# Patient Record
Sex: Female | Born: 2000 | Race: Black or African American | Hispanic: No | Marital: Single | Smoking: Never smoker
Health system: Southern US, Community
[De-identification: ages and names within clinical notes are randomized; demographics above are authoritative.]

## PROBLEM LIST (undated history)

## (undated) DIAGNOSIS — R569 Unspecified convulsions: Secondary | ICD-10-CM

## (undated) DIAGNOSIS — R51 Headache: Secondary | ICD-10-CM

## (undated) DIAGNOSIS — G43909 Migraine, unspecified, not intractable, without status migrainosus: Secondary | ICD-10-CM

## (undated) DIAGNOSIS — H539 Unspecified visual disturbance: Secondary | ICD-10-CM

## (undated) DIAGNOSIS — M419 Scoliosis, unspecified: Secondary | ICD-10-CM

## (undated) DIAGNOSIS — R519 Headache, unspecified: Secondary | ICD-10-CM

## (undated) HISTORY — PX: TONSILLECTOMY AND ADENOIDECTOMY: SHX28

---
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
## 2020-04-07 ENCOUNTER — Encounter (HOSPITAL_BASED_OUTPATIENT_CLINIC_OR_DEPARTMENT_OTHER): Payer: Self-pay

## 2020-04-07 ENCOUNTER — Other Ambulatory Visit: Payer: Self-pay

## 2020-04-07 ENCOUNTER — Emergency Department (HOSPITAL_BASED_OUTPATIENT_CLINIC_OR_DEPARTMENT_OTHER)
Admission: EM | Admit: 2020-04-07 | Discharge: 2020-04-07 | Disposition: A | Discharge status: Home / Self Care | Payer: Medicaid Other | Attending: Emergency Medicine | Admitting: Emergency Medicine

## 2020-04-07 DIAGNOSIS — K625 Hemorrhage of anus and rectum: Secondary | ICD-10-CM | POA: Insufficient documentation

## 2020-04-07 LAB — CBC WITH DIFFERENTIAL/PLATELET
Abs Immature Granulocytes: 0.02 10*3/uL (ref 0.00–0.07)
Basophils Absolute: 0.1 10*3/uL (ref 0.0–0.1)
Basophils Relative: 1 %
Eosinophils Absolute: 0.2 10*3/uL (ref 0.0–0.5)
Eosinophils Relative: 2 %
HCT: 40.5 % (ref 36.0–46.0)
Hemoglobin: 12.4 g/dL (ref 12.0–15.0)
Immature Granulocytes: 0 %
Lymphocytes Relative: 38 %
Lymphs Abs: 3.5 10*3/uL (ref 0.7–4.0)
MCH: 20 pg — ABNORMAL LOW (ref 26.0–34.0)
MCHC: 30.6 g/dL (ref 30.0–36.0)
MCV: 65.3 fL — ABNORMAL LOW (ref 80.0–100.0)
Monocytes Absolute: 0.6 10*3/uL (ref 0.1–1.0)
Monocytes Relative: 6 %
Neutro Abs: 4.9 10*3/uL (ref 1.7–7.7)
Neutrophils Relative %: 53 %
Platelets: 316 10*3/uL (ref 150–400)
RBC: 6.2 MIL/uL — ABNORMAL HIGH (ref 3.87–5.11)
RDW: 17.9 % — ABNORMAL HIGH (ref 11.5–15.5)
WBC: 9.2 10*3/uL (ref 4.0–10.5)
nRBC: 0 % (ref 0.0–0.2)

## 2020-04-07 LAB — OCCULT BLOOD X 1 CARD TO LAB, STOOL: Fecal Occult Bld: NEGATIVE

## 2020-04-07 LAB — COMPREHENSIVE METABOLIC PANEL
ALT: 15 U/L (ref 0–44)
AST: 17 U/L (ref 15–41)
Albumin: 4.2 g/dL (ref 3.5–5.0)
Alkaline Phosphatase: 71 U/L (ref 38–126)
Anion gap: 10 (ref 5–15)
BUN: 15 mg/dL (ref 6–20)
CO2: 24 mmol/L (ref 22–32)
Calcium: 9.2 mg/dL (ref 8.9–10.3)
Chloride: 104 mmol/L (ref 98–111)
Creatinine, Ser: 0.89 mg/dL (ref 0.44–1.00)
GFR, Estimated: >60 mL/min (ref >60)
Glucose, Bld: 96 mg/dL (ref 70–99)
Potassium: 4.1 mmol/L (ref 3.5–5.1)
Sodium: 138 mmol/L (ref 135–145)
Total Bilirubin: 0.2 mg/dL — ABNORMAL LOW (ref 0.3–1.2)
Total Protein: 7.3 g/dL (ref 6.5–8.1)

## 2020-04-07 NOTE — Discharge Instructions (Signed)
Follow-up with the gastroenterologist on this matter. Be sure to stay well-hydrated by drinking plenty of water. Include fiber in your diet to ensure bowel movements are regular and soft. Return to the emergency department for abdominal pain, fever with your symptoms, bleeding in between bowel movements, passing out, chest pain, shortness of breath, or any other major concerns.

## 2020-04-07 NOTE — ED Triage Notes (Addendum)
Pt states she gave birth 3 months ago. Believes she has a hemorrhoid. States has been bleeding for the past month, worse the past 3 days. Having to wear a pad. States feeling lightheaded/SOB. Hx of anemia

## 2020-04-07 NOTE — ED Provider Notes (Signed)
[facility] EMERGENCY DEPARTMENT Provider Note   CSN: [id number] Arrival date & time: 04/07/20  1721     History Chief Complaint  Patient presents with  . Rectal Bleeding    Kelli Bishop is a 19 y.o. female.  HPI      Kelli Bishop is a 19 y.o. female, with a history of migraines, MS, presenting to the ED with rectal bleeding for the past few months. Patient states she delivered her child about 3 months ago. She started to have rectal bleeding with bowel movements around that time. She states her OB/GYN told her that it was probably a hemorrhoid. She began to have more bleeding with bowel movement over the last week. She does not have bleeding without bowel movement. She does have some pain with bowel movements, however, denies any difficulty with bowel movements. Denies anticoagulation. Denies fever/chills, nausea/vomiting, constipation, rectal pain, rectal trauma, vaginal bleeding, abdominal pain, syncope, shortness of breath, chest pain, or any other complaints.  Past Medical History:  Diagnosis Date  . Headache   . Migraines   . Migraines   . Multilevel scoliosis   . Seizures ([facility])   . Vision abnormalities     Patient Active Problem List   Diagnosis Date Noted  . Adjustment reaction to medical therapy   . MS (multiple sclerosis) ([facility])   . Diplopia 07/05/2016  . Dizziness 07/05/2016  . Multifocal distribution of white matter abnormalities present on MRI 07/05/2016  . Back pain   . Incontinence   . Vasovagal syncope 11/13/2015  . Moderate headache 11/13/2015  . Seizure-like activity ([facility]) 11/13/2015    Past Surgical History:  Procedure Laterality Date  . TONSILLECTOMY AND ADENOIDECTOMY Bilateral      OB History    Gravida  1   Para      Term      Preterm      AB      Living        SAB      TAB      Ectopic      Multiple      Live Births              Family History  Problem Relation Age of Onset  . Migraines Mother          Resolved   . Depression Father   . Anxiety disorder Father   . Schizophrenia Maternal Grandmother   . Migraines Maternal Grandmother   . Hypertension Maternal Grandmother   . Diabetes Maternal Grandmother     Social History   Tobacco Use  . Smoking status: Never Smoker  . Smokeless tobacco: Never Used  Substance Use Topics  . Alcohol use: No  . Drug use: No    Comment: Has tried marijuana    Home Medications Prior to Admission medications   Medication Sig Start Date End Date Taking? Authorizing Provider  acetaminophen (TYLENOL) 500 MG tablet Take 1,000 mg by mouth every 6 (six) hours as needed for headache (pain).     [provider]  calcium carbonate (TUMS - DOSED IN MG ELEMENTAL CALCIUM) 500 MG chewable tablet Chew 2 tablets (400 mg of elemental calcium total) by mouth 3 (three) times daily as needed for indigestion or heartburn. 07/24/19   Gerrit Heck, CNM  cholecalciferol (VITAMIN D) 1000 units tablet Take 1 tablet (1,000 Units total) by mouth daily. 07/08/16   Rockney Ghee, MD  cyclobenzaprine (FLEXERIL) 5 MG tablet Take 1 tablet (5 mg  total) by mouth 3 (three) times daily as needed for muscle spasms. 07/24/19   Gerrit Heck, CNM  multivitamin (VIT Lorel [place] C) CHEW chewable tablet Chew 2 tablets by mouth daily.    [provider]    Allergies    Orange fruit [citrus]  Review of Systems   Review of Systems  Constitutional: Negative for chills and fever.  Respiratory: Negative for shortness of breath.   Cardiovascular: Negative for chest pain.  Gastrointestinal: Positive for blood in stool. Negative for abdominal pain, constipation, diarrhea, nausea and vomiting.  Genitourinary: Negative for vaginal bleeding and vaginal discharge.  Musculoskeletal: Negative for back pain.  Neurological: Negative for syncope and weakness.  All other systems reviewed and are negative.   Physical Exam Updated Vital Signs BP 118/85 (BP Location: Left Arm)    Pulse 87   Temp 98.3 F (36.8 C) (Oral)   Resp 18   Ht 5\' 6"  (1.676 m)   Wt 87 kg   SpO2 100%   BMI 30.96 kg/m   Physical Exam Vitals and nursing note reviewed. Exam conducted with a chaperone present.  Constitutional:      General: She is not in acute distress.    Appearance: She is well-developed. She is not diaphoretic.  HENT:     Head: Normocephalic and atraumatic.     Mouth/Throat:     Mouth: Mucous membranes are moist.     Pharynx: Oropharynx is clear.  Eyes:     Conjunctiva/sclera: Conjunctivae normal.  Cardiovascular:     Rate and Rhythm: Normal rate and regular rhythm.     Pulses: Normal pulses.          Radial pulses are 2+ on the right side and 2+ on the left side.  Pulmonary:     Effort: Pulmonary effort is normal. No respiratory distress.  Abdominal:     Palpations: Abdomen is soft.     Tenderness: There is no abdominal tenderness. There is no guarding.  Genitourinary:    Rectum: Guaiac result negative.     Comments: Rectal Exam:  No external hemorrhoids, fissures, or lesions noted.  No frank blood or melena. No stool burden.  No rectal tenderness. No foreign bodies noted.   Med Tech served as chaperone during the rectal exam. Musculoskeletal:     Cervical back: Neck supple.  Lymphadenopathy:     Cervical: No cervical adenopathy.  Skin:    General: Skin is warm and dry.  Neurological:     Mental Status: She is alert.  Psychiatric:        Mood and Affect: Mood and affect normal.        Speech: Speech normal.        Behavior: Behavior normal.     ED Results / Procedures / Treatments   Labs (all labs ordered are listed, but only abnormal results are displayed) Labs Reviewed  COMPREHENSIVE METABOLIC PANEL - Abnormal; Notable for the following components:      Result Value   Total Bilirubin 0.2 (*)    All other components within normal limits  CBC WITH DIFFERENTIAL/PLATELET - Abnormal; Notable for the following components:   RBC 6.20 (*)    MCV  65.3 (*)    MCH 20.0 (*)    RDW 17.9 (*)    All other components within normal limits  OCCULT BLOOD X 1 CARD TO LAB, STOOL    EKG None  Radiology No results found.  Procedures Procedures (including critical care time)  Medications Ordered in  ED Medications - No data to display  ED Course  I have reviewed the triage vital signs and the nursing notes.  Pertinent labs & imaging results that were available during my care of the patient were reviewed by me and considered in my medical decision making (see chart for details).    MDM Rules/Calculators/A&P                          Patient presents with complaint of rectal bleeding. Patient is nontoxic appearing, afebrile, not tachycardic, not tachypneic, not hypotensive, excellent SPO2 on room air, and is in no apparent distress.   I have reviewed the patient's chart to obtain more information.   I reviewed and interpreted the patient's labs. Abdominal exam benign. No evidence of anemia. No frank rectal bleeding on exam. Hemoccult negative. GI follow-up recommended.  Return precautions discussed. Patient voices understanding of these instructions, accepts the plan, and is comfortable with discharge.     Final Clinical Impression(s) / ED Diagnoses Final diagnoses:  Rectal bleeding    Rx / DC Orders ED Discharge Orders    None       Anselm Pancoast, PA-C 04/07/20 2009    Virgina Norfolk, DO 04/07/20 2011

## 2021-02-28 ENCOUNTER — Emergency Department (HOSPITAL_BASED_OUTPATIENT_CLINIC_OR_DEPARTMENT_OTHER): Payer: Medicaid Other

## 2021-02-28 ENCOUNTER — Encounter (HOSPITAL_BASED_OUTPATIENT_CLINIC_OR_DEPARTMENT_OTHER): Payer: Self-pay | Admitting: *Deleted

## 2021-02-28 ENCOUNTER — Emergency Department (HOSPITAL_BASED_OUTPATIENT_CLINIC_OR_DEPARTMENT_OTHER)
Admission: EM | Admit: 2021-02-28 | Discharge: 2021-02-28 | Disposition: A | Discharge status: Home / Self Care | Payer: Medicaid Other | Attending: Emergency Medicine | Admitting: Emergency Medicine

## 2021-02-28 ENCOUNTER — Other Ambulatory Visit: Payer: Self-pay

## 2021-02-28 DIAGNOSIS — X501XXA Overexertion from prolonged static or awkward postures, initial encounter: Secondary | ICD-10-CM | POA: Insufficient documentation

## 2021-02-28 DIAGNOSIS — S8992XA Unspecified injury of left lower leg, initial encounter: Secondary | ICD-10-CM | POA: Insufficient documentation

## 2021-02-28 DIAGNOSIS — M25562 Pain in left knee: Secondary | ICD-10-CM | POA: Diagnosis not present

## 2021-02-28 MED ORDER — KETOROLAC TROMETHAMINE 30 MG/ML IJ SOLN
30.0000 mg | Freq: Once | INTRAMUSCULAR | Status: AC
  Administered 2021-02-28: 30 mg via INTRAMUSCULAR
  Filled 2021-02-28: qty 1

## 2021-02-28 NOTE — ED Triage Notes (Signed)
Brought in by ems from home c/o left knee pain , hx of same

## 2021-02-28 NOTE — Discharge Instructions (Addendum)
Take Tylenol and ibuprofen as needed for pain. Use the immobilization brace as needed when you are not in the shower or sleep at night.  You can also use the crutches throughout the day to help with pain.  Please schedule follow-up with the orthopedic group listed above for additional evaluation.  They may need to do an MRI to rule out any ligamentous involvement.  If things change or worsen please turn back to the ED as needed.

## 2021-02-28 NOTE — ED Provider Notes (Signed)
[facility] EMERGENCY DEPARTMENT Provider Note   CSN: [id number] Arrival date & time: 02/28/21  1434     History Chief Complaint  Patient presents with   Knee Injury    Kelli Bishop is a 20 y.o. female.  HPI  Patient presents with left knee pain.  This happened acutely, she was sitting on the floor in a squatting position and then stepped up.  She heard a popping sound in the left knee.  Was unable to bear weight afterwards, denies any twisting sensation or fall on the knee.  No previous surgeries on the knee.  Reports this is the 6 or 7 time this is happened, usually when she goes and squatting to standing she will hear popping position.  States the pain was worse this time than previous times.  Pain was a 9 out of 10 when it initially started, it improved with putting on a knee brace.  It is currently a 5 out of 10.  Pain is exacerbated by putting pressure on it.  She is able to stand with the brace on.  Past Medical History:  Diagnosis Date   Headache    Migraines    Migraines    Multilevel scoliosis    Seizures (HCC)    Vision abnormalities     Patient Active Problem List   Diagnosis Date Noted   Adjustment reaction to medical therapy    MS (multiple sclerosis) (HCC)    Diplopia 07/05/2016   Dizziness 07/05/2016   Multifocal distribution of white matter abnormalities present on MRI 07/05/2016   Back pain    Incontinence    Vasovagal syncope 11/13/2015   Moderate headache 11/13/2015   Seizure-like activity (HCC) 11/13/2015    Past Surgical History:  Procedure Laterality Date   TONSILLECTOMY AND ADENOIDECTOMY Bilateral      OB History     Gravida  1   Para      Term      Preterm      AB      Living         SAB      IAB      Ectopic      Multiple      Live Births              Family History  Problem Relation Age of Onset   Migraines Mother        Resolved    Depression Father    Anxiety disorder Father    Schizophrenia  Maternal Grandmother    Migraines Maternal Grandmother    Hypertension Maternal Grandmother    Diabetes Maternal Grandmother     Social History   Tobacco Use   Smoking status: Never   Smokeless tobacco: Never  Substance Use Topics   Alcohol use: No   Drug use: No    Comment: Has tried marijuana    Home Medications Prior to Admission medications   Medication Sig Start Date End Date Taking? Authorizing Provider  acetaminophen (TYLENOL) 500 MG tablet Take 1,000 mg by mouth every 6 (six) hours as needed for headache (pain).     [provider]  calcium carbonate (TUMS - DOSED IN MG ELEMENTAL CALCIUM) 500 MG chewable tablet Chew 2 tablets (400 mg of elemental calcium total) by mouth 3 (three) times daily as needed for indigestion or heartburn. 07/24/19   Gerrit Heck, CNM  cholecalciferol (VITAMIN D) 1000 units tablet Take 1 tablet (1,000 Units total) by mouth daily. 07/08/16  Rockney Ghee, MD  cyclobenzaprine (FLEXERIL) 5 MG tablet Take 1 tablet (5 mg total) by mouth 3 (three) times daily as needed for muscle spasms. 07/24/19   Gerrit Heck, CNM  multivitamin (VIT Lorel Monaco C) CHEW chewable tablet Chew 2 tablets by mouth daily.    [provider]    Allergies    Orange fruit [citrus] and Orange oil  Review of Systems   Review of Systems  Constitutional:  Negative for fatigue and fever.  Musculoskeletal:  Positive for arthralgias and gait problem. Negative for joint swelling.   Physical Exam Updated Vital Signs BP 125/86 (BP Location: Left Arm)   Pulse 69   Temp 98.4 F (36.9 C) (Oral)   Resp 18   Ht 5' 5.5" (1.664 m)   Wt 96.6 kg   LMP 02/21/2021   SpO2 99%   BMI 34.91 kg/m   Physical Exam Vitals and nursing note reviewed. Exam conducted with a chaperone present.  Constitutional:      General: She is not in acute distress.    Appearance: Normal appearance.  HENT:     Head: Normocephalic and atraumatic.  Eyes:     General: No scleral  icterus.    Extraocular Movements: Extraocular movements intact.     Pupils: Pupils are equal, round, and reactive to light.  Cardiovascular:     Comments: DP and PT are 2+. Musculoskeletal:        General: Tenderness present. No swelling.     Comments: Decreased range of motion to the left knee.  Patient is able to withstand flexion, extension, internal and external rotation minimally.  No tenderness with mobilization of the patella.  No significant effusion.  Skin:    Capillary Refill: Capillary refill takes less than 2 seconds.     Coloration: Skin is not jaundiced.  Neurological:     Mental Status: She is alert. Mental status is at baseline.     Sensory: No sensory deficit.     Coordination: Coordination normal.    ED Results / Procedures / Treatments   Labs (all labs ordered are listed, but only abnormal results are displayed) Labs Reviewed - No data to display  EKG None  Radiology DG Knee Complete 4 Views Left  Result Date: 02/28/2021 CLINICAL DATA:  Left knee pain. EXAM: LEFT KNEE - COMPLETE 4+ VIEW COMPARISON:  None. FINDINGS: No evidence of fracture, dislocation, or joint effusion. No evidence of arthropathy or other focal bone abnormality. Soft tissues are unremarkable. IMPRESSION: Negative. Electronically Signed   By: Aram Candela M.D.   On: 02/28/2021 15:39    Procedures Procedures   Medications Ordered in ED Medications - No data to display  ED Course  I have reviewed the triage vital signs and the nursing notes.  Pertinent labs & imaging results that were available during my care of the patient were reviewed by me and considered in my medical decision making (see chart for details).    MDM Rules/Calculators/A&P                           Patient vitals are stable.  Not febrile or tachycardic, doubt a septic joint.  She is also able to tolerate range another reason at all think this is a septic joint.  Radiograph is negative for any fracture dislocation.   Patient is very tender, but she is able to bear weight.  No previous history of MRI or surgeries to the knee, but frequent  history of "popping" followed by pain and requiring of immobilization brace.  Patient is able to bear weight and ambulate although does elicit some pain.  She is neurologically intact, I think she is appropriate for discharge with follow-up with orthopedics.  Will give crutches and have her in her immobilization brace.  Patient could have a ligamentous tear, will likely need outpatient MRI.  Can be done on a nonemergent basis.  Stable for discharge at this time.  Final Clinical Impression(s) / ED Diagnoses Final diagnoses:  None    Rx / DC Orders ED Discharge Orders     None        Theron Arista, Cordelia Poche 02/28/21 1910    Milagros Loll, MD 03/04/21 [phone]

## 2023-10-12 ENCOUNTER — Emergency Department (HOSPITAL_BASED_OUTPATIENT_CLINIC_OR_DEPARTMENT_OTHER)
Admission: EM | Admit: 2023-10-12 | Discharge: 2023-10-12 | Discharge status: Against Medical Advice | Attending: Emergency Medicine | Admitting: Emergency Medicine

## 2023-10-12 ENCOUNTER — Other Ambulatory Visit: Payer: Self-pay

## 2023-10-12 ENCOUNTER — Encounter (HOSPITAL_BASED_OUTPATIENT_CLINIC_OR_DEPARTMENT_OTHER): Payer: Self-pay | Admitting: *Deleted

## 2023-10-12 DIAGNOSIS — N939 Abnormal uterine and vaginal bleeding, unspecified: Secondary | ICD-10-CM | POA: Diagnosis not present

## 2023-10-12 DIAGNOSIS — K625 Hemorrhage of anus and rectum: Secondary | ICD-10-CM | POA: Diagnosis present

## 2023-10-12 DIAGNOSIS — R102 Pelvic and perineal pain unspecified side: Secondary | ICD-10-CM

## 2023-10-12 DIAGNOSIS — R109 Unspecified abdominal pain: Secondary | ICD-10-CM | POA: Insufficient documentation

## 2023-10-12 DIAGNOSIS — K5900 Constipation, unspecified: Secondary | ICD-10-CM

## 2023-10-12 DIAGNOSIS — Z3201 Encounter for pregnancy test, result positive: Secondary | ICD-10-CM

## 2023-10-12 DIAGNOSIS — K921 Melena: Secondary | ICD-10-CM

## 2023-10-12 LAB — CBC WITH DIFFERENTIAL/PLATELET
Abs Immature Granulocytes: 0.01 10*3/uL (ref 0.00–0.07)
Basophils Absolute: 0 10*3/uL (ref 0.0–0.1)
Basophils Relative: 0 %
Eosinophils Absolute: 0.1 10*3/uL (ref 0.0–0.5)
Eosinophils Relative: 1 %
HCT: 42.3 % (ref 36.0–46.0)
Hemoglobin: 12.9 g/dL (ref 12.0–15.0)
Immature Granulocytes: 0 %
Lymphocytes Relative: 28 %
Lymphs Abs: 1.7 10*3/uL (ref 0.7–4.0)
MCH: 20.7 pg — ABNORMAL LOW (ref 26.0–34.0)
MCHC: 30.5 g/dL (ref 30.0–36.0)
MCV: 67.8 fL — ABNORMAL LOW (ref 80.0–100.0)
Monocytes Absolute: 0.4 10*3/uL (ref 0.1–1.0)
Monocytes Relative: 7 %
Neutro Abs: 3.8 10*3/uL (ref 1.7–7.7)
Neutrophils Relative %: 64 %
Platelets: 241 10*3/uL (ref 150–400)
RBC: 6.24 MIL/uL — ABNORMAL HIGH (ref 3.87–5.11)
RDW: 17 % — ABNORMAL HIGH (ref 11.5–15.5)
WBC: 6 10*3/uL (ref 4.0–10.5)
nRBC: 0 % (ref 0.0–0.2)

## 2023-10-12 LAB — COMPREHENSIVE METABOLIC PANEL WITH GFR
ALT: 19 U/L (ref 0–44)
AST: 22 U/L (ref 15–41)
Albumin: 4.6 g/dL (ref 3.5–5.0)
Alkaline Phosphatase: 74 U/L (ref 38–126)
Anion gap: 11 (ref 5–15)
BUN: 13 mg/dL (ref 6–20)
CO2: 24 mmol/L (ref 22–32)
Calcium: 9.7 mg/dL (ref 8.9–10.3)
Chloride: 103 mmol/L (ref 98–111)
Creatinine, Ser: 0.81 mg/dL (ref 0.44–1.00)
GFR, Estimated: >60 mL/min (ref >60)
Glucose, Bld: 93 mg/dL (ref 70–99)
Potassium: 4 mmol/L (ref 3.5–5.1)
Sodium: 138 mmol/L (ref 135–145)
Total Bilirubin: 0.4 mg/dL (ref 0.0–1.2)
Total Protein: 6.9 g/dL (ref 6.5–8.1)

## 2023-10-12 LAB — LIPASE, BLOOD: Lipase: 28 U/L (ref 11–51)

## 2023-10-12 LAB — HCG, QUANTITATIVE, PREGNANCY: hCG, Beta Chain, Quant, S: 737 m[IU]/mL — ABNORMAL HIGH (ref <5)

## 2023-10-12 LAB — PREGNANCY, URINE: Preg Test, Ur: POSITIVE — AB

## 2023-10-12 MED ORDER — ONDANSETRON 4 MG PO TBDP
4.0000 mg | ORAL_TABLET | Freq: Once | ORAL | Status: AC
  Administered 2023-10-12: 4 mg via ORAL
  Filled 2023-10-12: qty 1

## 2023-10-12 MED ORDER — ACETAMINOPHEN 500 MG PO TABS
1000.0000 mg | ORAL_TABLET | Freq: Once | ORAL | Status: AC
  Administered 2023-10-12: 1000 mg via ORAL
  Filled 2023-10-12: qty 2

## 2023-10-12 NOTE — ED Provider Triage Note (Signed)
 Emergency Medicine Provider Triage Evaluation Note  Kelli Bishop , a 23 y.o. female  was evaluated in triage.  Pt complains of lower abdominal pain 12 hours, than had episode of bright red blood in stool after BM, thinks this is small amount of blood and constant since last BM. Has hx of hemorrhoids.  Review of Systems  Positive: Abd pain, N Negative: V D constipation   Physical Exam  BP (!) 140/85   Pulse 92   Temp 98 F (36.7 C)   Resp 18   Ht 5\' 5"  (1.651 m)   Wt 81.6 kg   LMP 09/19/2023   SpO2 100%   BMI 29.95 kg/m  Gen:   Awake, no distress   Resp:  Normal effort  MSK:   Moves extremities without difficulty  Other:    Medical Decision Making  Medically screening exam initiated at 2:50 PM.  Appropriate orders placed.  Kelli Bishop was informed that the remainder of the evaluation will be completed by another provider, this initial triage assessment does not replace that evaluation, and the importance of remaining in the ED until their evaluation is complete.  No BT   Eudora Heron, PA-C 10/12/23 1451

## 2023-10-12 NOTE — ED Notes (Signed)
 Patient wanting to leave. States she has to pick up daughter from [employer]. Aware of pending US , however reports she can't wait. States "ill see my regular doctor tomorrow". PA informed

## 2023-10-12 NOTE — ED Provider Notes (Signed)
 [place] EMERGENCY DEPARTMENT AT [facility] Provider Note   CSN: [id number] Arrival date & time: 10/12/23  1406     History  Chief Complaint  Patient presents with  . Rectal Bleeding    Kelli Bishop is a 23 y.o. female who presents emergency department with chief complaint of abdominal pain and rectal bleeding.  She has a past medical history of MS and is currently under treatment with [facility]. She had onset of intense cramping and.  She had urgency to urinate and defecate.  This went on all night and she was able unable to do both.  She states around 12:30 PM today she finally made a hard bowel movement and was able to urinate fully and empty her bladder.  She has had urgency to urinate since and has also had pain in her stomach and rectum.  She reports that when she made this bowel movement she noted blood in the toilet and had blood on her tissue paper when she wiped.  She had a hx of previous diagnosis of colitis and was told that this could be inflammatory as she has another autoimmune process but she has never followed up with GI specialist and was treated with antibiotics and improved.  Patient reports that she had some similar issues like this when she was pregnant in the past.  She is not currently taking birth control as she has tried to get pregnant her last menstrual period was on 09/22/2023.   Rectal Bleeding      Home Medications Prior to Admission medications   Medication Sig Start Date End Date Taking? Authorizing Provider  acetaminophen  (TYLENOL ) 500 MG tablet Take 1,000 mg by mouth every 6 (six) hours as needed for headache (pain).     [provider]  calcium  carbonate (TUMS - DOSED IN MG ELEMENTAL CALCIUM ) 500 MG chewable tablet Chew 2 tablets (400 mg of elemental calcium  total) by mouth 3 (three) times daily as needed for indigestion or heartburn. 07/24/19   Loetta Ringer, CNM  cholecalciferol  (VITAMIN D ) 1000 units tablet Take 1 tablet  (1,000 Units total) by mouth daily. 07/08/16   Anton Kirsten, MD  cyclobenzaprine  (FLEXERIL ) 5 MG tablet Take 1 tablet (5 mg total) by mouth 3 (three) times daily as needed for muscle spasms. 07/24/19   Loetta Ringer, CNM  multivitamin (VIT W/EXTRA C) CHEW chewable tablet Chew 2 tablets by mouth daily.    [provider]      Allergies    Orange fruit [citrus] and Orange oil    Review of Systems   Review of Systems  Gastrointestinal:  Positive for hematochezia.    Physical Exam Updated Vital Signs BP (!) 140/85   Pulse 92   Temp 98 F (36.7 C)   Resp 18   Ht 5\' 5"  (1.651 m)   Wt 81.6 kg   LMP 09/19/2023   SpO2 100%   BMI 29.95 kg/m  Physical Exam Vitals and nursing note reviewed.  Constitutional:      General: She is not in acute distress.    Appearance: She is well-developed. She is not diaphoretic.  HENT:     Head: Normocephalic and atraumatic.     Right Ear: External ear normal.     Left Ear: External ear normal.     Nose: Nose normal.     Mouth/Throat:     Mouth: Mucous membranes are moist.  Eyes:     General: No scleral icterus.    Conjunctiva/sclera: Conjunctivae  normal.  Cardiovascular:     Rate and Rhythm: Normal rate and regular rhythm.     Heart sounds: Normal heart sounds. No murmur heard.    No friction rub. No gallop.  Pulmonary:     Effort: Pulmonary effort is normal. No respiratory distress.     Breath sounds: Normal breath sounds.  Abdominal:     General: Bowel sounds are normal. There is no distension.     Palpations: Abdomen is soft. There is no mass.     Tenderness: There is abdominal tenderness.    Musculoskeletal:     Cervical back: Normal range of motion.  Skin:    General: Skin is warm and dry.  Neurological:     Mental Status: She is alert and oriented to person, place, and time.  Psychiatric:        Behavior: Behavior normal.    ED Results / Procedures / Treatments   Labs (all labs ordered are listed, but only  abnormal results are displayed) Labs Reviewed  CBC WITH DIFFERENTIAL/PLATELET - Abnormal; Notable for the following components:      Result Value   RBC 6.24 (*)    MCV 67.8 (*)    MCH 20.7 (*)    RDW 17.0 (*)    All other components within normal limits  PREGNANCY, URINE - Abnormal; Notable for the following components:   Preg Test, Ur POSITIVE (*)    All other components within normal limits  COMPREHENSIVE METABOLIC PANEL WITH GFR  LIPASE, BLOOD  HCG, QUANTITATIVE, PREGNANCY  TYPE AND SCREEN    EKG None  Radiology No results found.  Procedures Procedures    Medications Ordered in ED Medications  ondansetron  (ZOFRAN -ODT) disintegrating tablet 4 mg (4 mg Oral Given 10/12/23 1506)    ED Course/ Medical Decision Making/ A&P                                 Medical Decision Making Amount and/or Complexity of Data Reviewed Labs: ordered.  Risk OTC drugs.   Patient here for evaluation of abdominal pain and hematochezia.  She had 1 bloody bowel movement.  Original diagnosis diagnosis includes AVM, infectious or inflammatory colitis, constipation, fissure less likely cancer. Patient's labs reveal incidental positive pregnancy test which she patient is very excited about follow-up quantitative hCG 737.  She has no elevated white blood cell count lipase within normal limits CMP is normal.  Initially my plan was to order a CT scan of the abdomen and pelvis however because she is in early pregnancy this would not be optimal. I also ordered a pelvic ultrasound which is currently pending signout given to PA [employer] at shift handoff.  Patient is stable.          Final Clinical Impression(s) / ED Diagnoses Final diagnoses:  None    Rx / DC Orders ED Discharge Orders     None         Tama Fails, PA-C 10/13/23 Lenord Radon, MD 10/16/23 1350

## 2023-10-12 NOTE — ED Notes (Signed)
 ED Provider at bedside.

## 2023-10-12 NOTE — ED Notes (Signed)
 PA Small at bedside, speaking to patient about plan of care. Patient agrees to sign AMA form

## 2023-10-12 NOTE — ED Provider Notes (Addendum)
 Handoff from Abby PA, pt here for abdominal pain, blood in stool after having hard bowel movementx1 day and occasional vaginal spotting. Having spasms in perineum. Has MS. Incidental positive pregnancy test. F/u on TVUS. Possible miralax, antibiotics?? Physical Exam  BP 126/84   Pulse 68   Temp 98.5 F (36.9 C) (Oral)   Resp 18   Ht 5\' 5"  (1.651 m)   Wt 81.6 kg   LMP 09/19/2023   SpO2 100%   BMI 29.95 kg/m   Physical Exam Vitals and nursing note reviewed.  Constitutional:      General: She is not in acute distress.    Appearance: She is well-developed.  HENT:     Head: Normocephalic and atraumatic.  Eyes:     Conjunctiva/sclera: Conjunctivae normal.  Cardiovascular:     Rate and Rhythm: Normal rate and regular rhythm.     Heart sounds: No murmur heard. Pulmonary:     Effort: Pulmonary effort is normal. No respiratory distress.     Breath sounds: Normal breath sounds.  Abdominal:     Palpations: Abdomen is soft.     Tenderness: There is abdominal tenderness in the right lower quadrant and left lower quadrant.  Musculoskeletal:        General: No swelling.     Cervical back: Neck supple.  Skin:    General: Skin is warm and dry.     Capillary Refill: Capillary refill takes less than 2 seconds.  Neurological:     Mental Status: She is alert.  Psychiatric:        Mood and Affect: Mood normal.     Procedures  Procedures  ED Course / MDM    Medical Decision Making After Abigail PA, left, patient informed nursing, that she would like to leave, transvaginal ultrasound is not completed, nursing talk to the patient, about AMA form, I went in and talk to the patient as well, given her pelvic pain, I recommended ultrasound, which she declined.  She is only had 1 bloody stool, and she states she is feeling a little bit better, I think this is unlikely to be infectious colitis, and more likely represents constipation, which was causing this.  I recommend that she take MiraLAX, to  help with her bowel, to help with constipation.  I instructed her to follow-up with an OB, and return if symptoms worsen, and informed her, given that she is likely so early on in the pregnancy, an ectopic cannot be ruled out without an ultrasound, and even with an ultrasound it may not be accurate.  She will need to follow-up with her OB, and she voiced understanding.  She left AGAINST MEDICAL ADVICE, and understand the risk and benefits of leaving this way  Amount and/or Complexity of Data Reviewed Labs: ordered. Radiology: ordered.  Risk OTC drugs.        Timmy Forbes, PA 10/12/23 2326    Timmy Forbes, [place] 10/12/23 2326    Almond Army, MD 10/16/23 1350

## 2023-10-12 NOTE — ED Triage Notes (Addendum)
 Patient to ED reporting sudden onset of rectal bleeding at midnight last night. "Maroon" in color with lower bilateral abd cramping. NV last night.   Pt reporting bleeding is constant and not specifically related to a bowel movement.   Hx of colitis.
